# Patient Record
Sex: Female | Born: 1950 | Race: White | Hispanic: No
Health system: Southern US, Community
[De-identification: ages and names within clinical notes are randomized; demographics above are authoritative.]

---

## 2016-09-11 DIAGNOSIS — E785 Hyperlipidemia, unspecified: Secondary | ICD-10-CM | POA: Insufficient documentation

## 2016-09-11 DIAGNOSIS — R3129 Other microscopic hematuria: Secondary | ICD-10-CM | POA: Insufficient documentation

## 2016-10-04 ENCOUNTER — Other Ambulatory Visit: Payer: Self-pay | Admitting: Surgery

## 2016-10-04 DIAGNOSIS — R921 Mammographic calcification found on diagnostic imaging of breast: Secondary | ICD-10-CM

## 2016-10-09 ENCOUNTER — Ambulatory Visit
Admission: RE | Admit: 2016-10-09 | Discharge: 2016-10-09 | Disposition: A | Discharge status: Home / Self Care | Payer: BLUE CROSS/BLUE SHIELD | Source: Ambulatory Visit | Attending: Surgery | Admitting: Surgery

## 2016-10-09 DIAGNOSIS — R921 Mammographic calcification found on diagnostic imaging of breast: Secondary | ICD-10-CM

## 2017-06-08 DIAGNOSIS — Z23 Encounter for immunization: Secondary | ICD-10-CM | POA: Insufficient documentation

## 2018-08-14 IMAGING — MG MM CLIP PLACEMENT
2 series · 2 of 2 positions shown · non-contrast
Comparison: Previous exam(s).

CLINICAL DATA: Post biopsy mammogram of the left breast for clip
placement.

EXAM:
DIAGNOSTIC LEFT MAMMOGRAM POST STEREOTACTIC BIOPSY

[L CC]
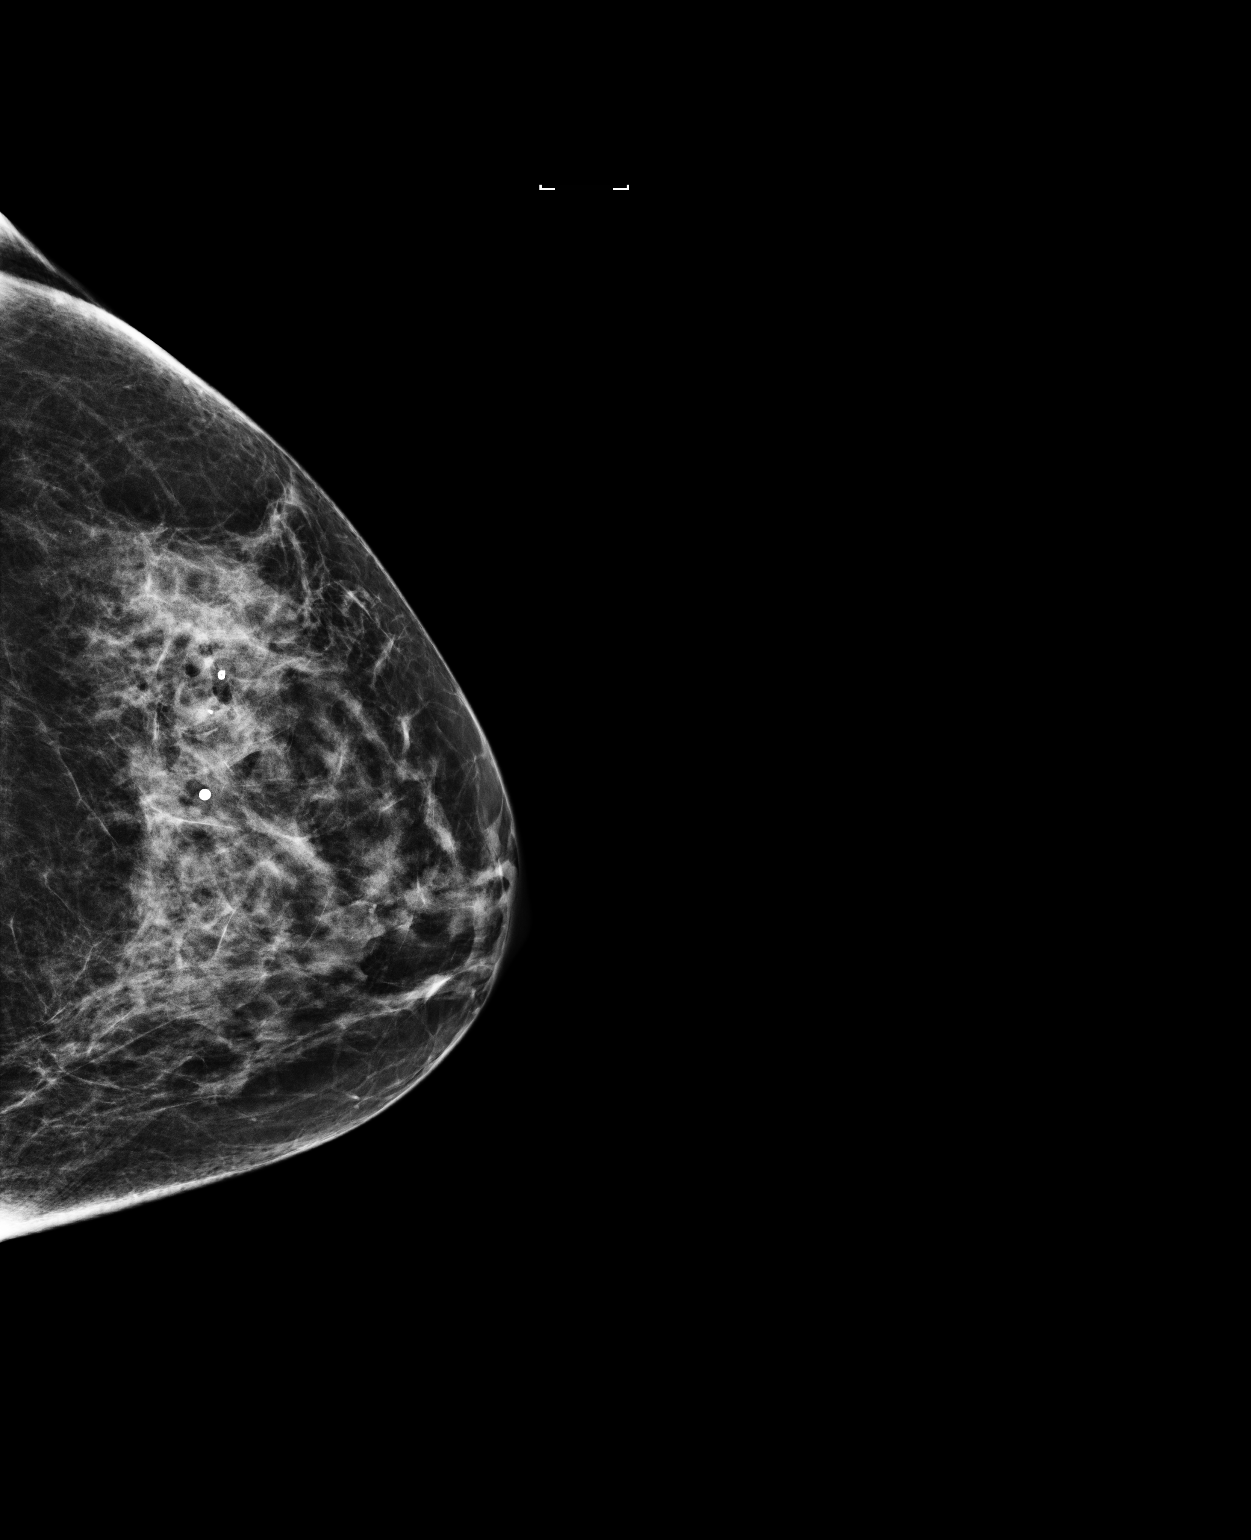

[L ML]
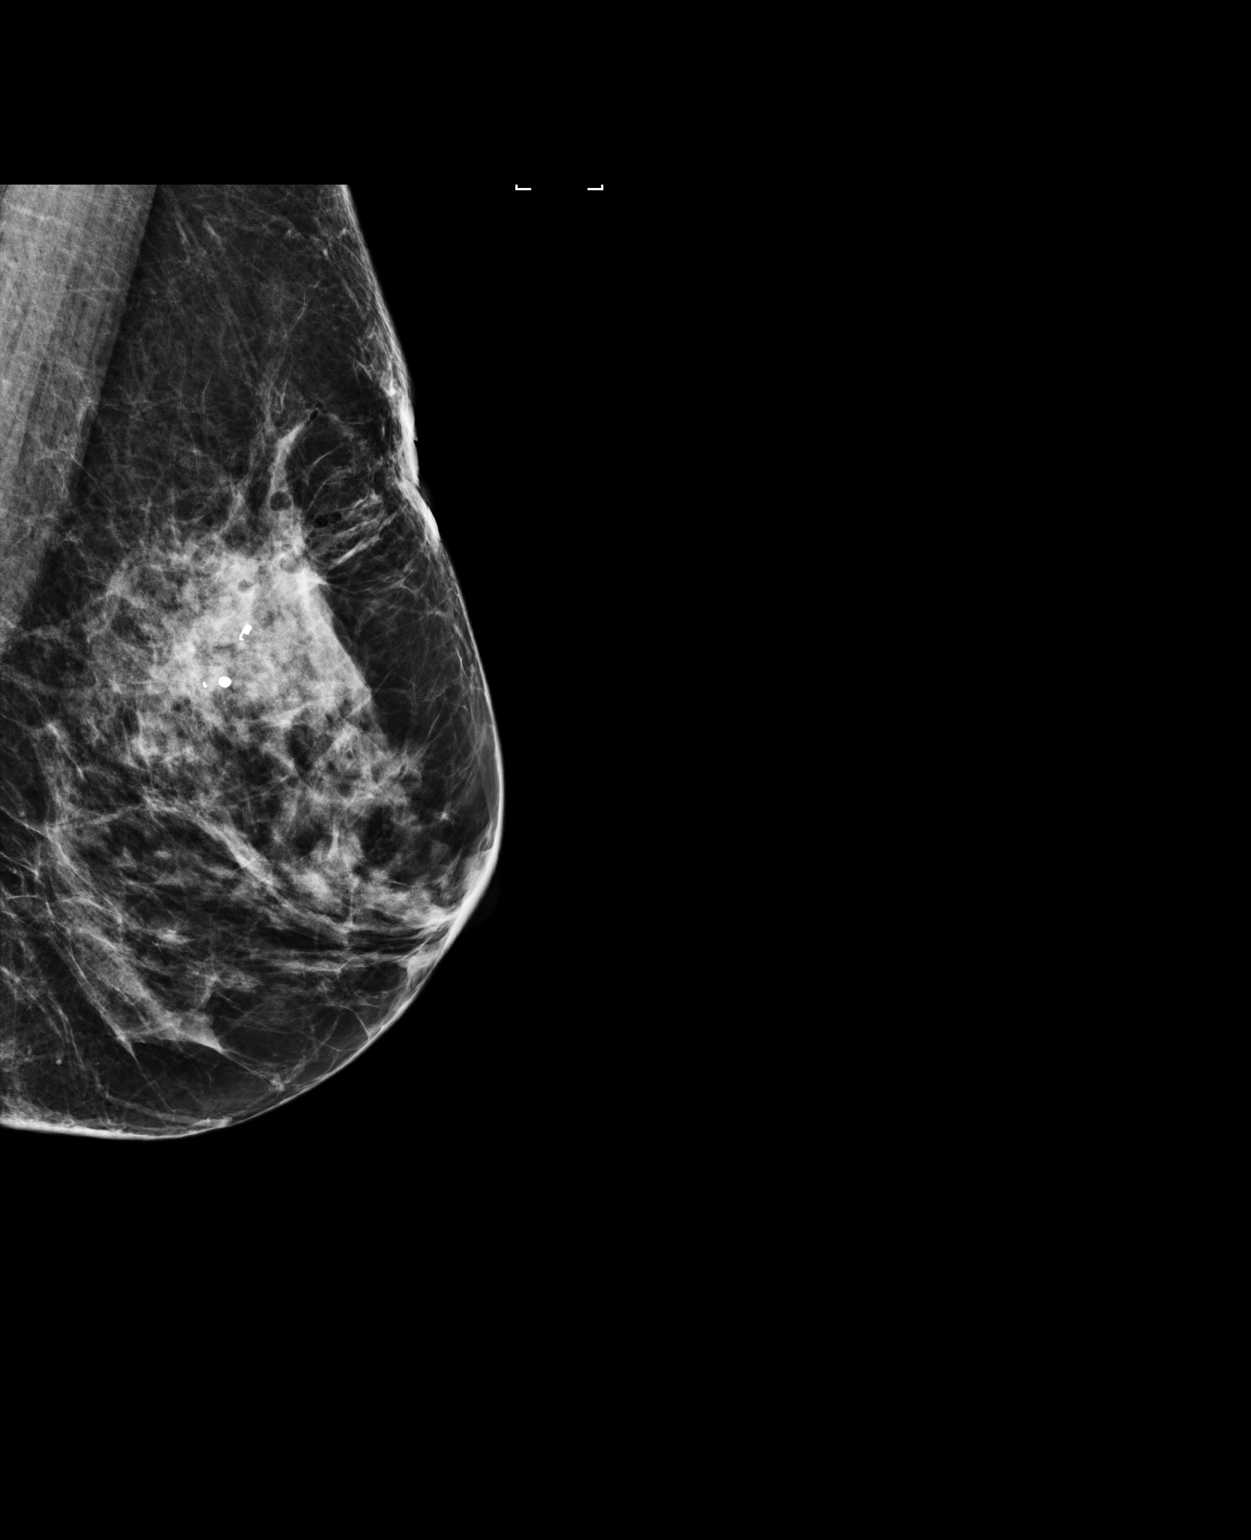

[2 of 2 positions shown; findings below may reference images not displayed]

FINDINGS: Mammographic images were obtained following stereotactic guided
biopsy of calcifications in the upper-outer quadrant of the left
breast. The coil shaped biopsy marking clip is appropriately
positioned at the intended site of biopsy in the upper-outer left
breast.
IMPRESSION: Appropriate positioning of the coil shaped biopsy marking clip in
the upper-outer left breast.

Final Assessment: Post Procedure Mammograms for Marker Placement

## 2019-04-30 DIAGNOSIS — E785 Hyperlipidemia, unspecified: Secondary | ICD-10-CM | POA: Diagnosis not present

## 2019-05-26 DIAGNOSIS — Z1211 Encounter for screening for malignant neoplasm of colon: Secondary | ICD-10-CM | POA: Diagnosis not present

## 2019-05-26 DIAGNOSIS — R030 Elevated blood-pressure reading, without diagnosis of hypertension: Secondary | ICD-10-CM | POA: Diagnosis not present

## 2019-05-26 DIAGNOSIS — Z23 Encounter for immunization: Secondary | ICD-10-CM | POA: Diagnosis not present

## 2019-05-26 DIAGNOSIS — E785 Hyperlipidemia, unspecified: Secondary | ICD-10-CM | POA: Diagnosis not present

## 2019-05-26 DIAGNOSIS — Z13820 Encounter for screening for osteoporosis: Secondary | ICD-10-CM | POA: Diagnosis not present

## 2019-06-11 DIAGNOSIS — Z1231 Encounter for screening mammogram for malignant neoplasm of breast: Secondary | ICD-10-CM | POA: Diagnosis not present

## 2019-07-02 DIAGNOSIS — H35371 Puckering of macula, right eye: Secondary | ICD-10-CM | POA: Diagnosis not present

## 2019-07-02 DIAGNOSIS — H25093 Other age-related incipient cataract, bilateral: Secondary | ICD-10-CM | POA: Diagnosis not present

## 2019-07-04 DIAGNOSIS — M85852 Other specified disorders of bone density and structure, left thigh: Secondary | ICD-10-CM | POA: Diagnosis not present

## 2019-07-04 DIAGNOSIS — Z780 Asymptomatic menopausal state: Secondary | ICD-10-CM | POA: Diagnosis not present

## 2019-07-04 DIAGNOSIS — M810 Age-related osteoporosis without current pathological fracture: Secondary | ICD-10-CM | POA: Diagnosis not present

## 2019-10-01 DIAGNOSIS — M71341 Other bursal cyst, right hand: Secondary | ICD-10-CM | POA: Diagnosis not present

## 2019-10-01 DIAGNOSIS — C44529 Squamous cell carcinoma of skin of other part of trunk: Secondary | ICD-10-CM | POA: Diagnosis not present

## 2019-10-01 DIAGNOSIS — L570 Actinic keratosis: Secondary | ICD-10-CM | POA: Diagnosis not present

## 2019-10-21 DIAGNOSIS — C44529 Squamous cell carcinoma of skin of other part of trunk: Secondary | ICD-10-CM | POA: Diagnosis not present

## 2020-01-19 DIAGNOSIS — E785 Hyperlipidemia, unspecified: Secondary | ICD-10-CM | POA: Diagnosis not present

## 2020-01-20 DIAGNOSIS — C44319 Basal cell carcinoma of skin of other parts of face: Secondary | ICD-10-CM | POA: Diagnosis not present

## 2020-01-20 DIAGNOSIS — L821 Other seborrheic keratosis: Secondary | ICD-10-CM | POA: Diagnosis not present

## 2020-01-20 DIAGNOSIS — X32XXXS Exposure to sunlight, sequela: Secondary | ICD-10-CM | POA: Diagnosis not present

## 2020-01-20 DIAGNOSIS — L570 Actinic keratosis: Secondary | ICD-10-CM | POA: Diagnosis not present

## 2020-01-20 DIAGNOSIS — I781 Nevus, non-neoplastic: Secondary | ICD-10-CM | POA: Diagnosis not present

## 2020-01-20 DIAGNOSIS — D485 Neoplasm of uncertain behavior of skin: Secondary | ICD-10-CM | POA: Diagnosis not present

## 2020-01-20 DIAGNOSIS — L812 Freckles: Secondary | ICD-10-CM | POA: Diagnosis not present

## 2020-01-20 DIAGNOSIS — L814 Other melanin hyperpigmentation: Secondary | ICD-10-CM | POA: Diagnosis not present

## 2020-01-22 DIAGNOSIS — M810 Age-related osteoporosis without current pathological fracture: Secondary | ICD-10-CM | POA: Diagnosis not present

## 2020-01-22 DIAGNOSIS — E785 Hyperlipidemia, unspecified: Secondary | ICD-10-CM | POA: Diagnosis not present

## 2020-01-22 DIAGNOSIS — M7062 Trochanteric bursitis, left hip: Secondary | ICD-10-CM | POA: Diagnosis not present

## 2020-01-22 DIAGNOSIS — Z1211 Encounter for screening for malignant neoplasm of colon: Secondary | ICD-10-CM | POA: Diagnosis not present

## 2020-01-22 DIAGNOSIS — R030 Elevated blood-pressure reading, without diagnosis of hypertension: Secondary | ICD-10-CM | POA: Diagnosis not present

## 2020-02-12 DIAGNOSIS — L821 Other seborrheic keratosis: Secondary | ICD-10-CM | POA: Diagnosis not present

## 2020-03-08 DIAGNOSIS — C44319 Basal cell carcinoma of skin of other parts of face: Secondary | ICD-10-CM | POA: Diagnosis not present

## 2020-05-05 DIAGNOSIS — L814 Other melanin hyperpigmentation: Secondary | ICD-10-CM | POA: Diagnosis not present

## 2020-05-05 DIAGNOSIS — X32XXXS Exposure to sunlight, sequela: Secondary | ICD-10-CM | POA: Diagnosis not present

## 2020-05-05 DIAGNOSIS — L570 Actinic keratosis: Secondary | ICD-10-CM | POA: Diagnosis not present

## 2020-05-05 DIAGNOSIS — Z85828 Personal history of other malignant neoplasm of skin: Secondary | ICD-10-CM | POA: Diagnosis not present

## 2020-05-05 DIAGNOSIS — L821 Other seborrheic keratosis: Secondary | ICD-10-CM | POA: Diagnosis not present

## 2020-06-24 DIAGNOSIS — Z1231 Encounter for screening mammogram for malignant neoplasm of breast: Secondary | ICD-10-CM | POA: Diagnosis not present

## 2020-07-20 DIAGNOSIS — H35371 Puckering of macula, right eye: Secondary | ICD-10-CM | POA: Diagnosis not present

## 2020-07-20 DIAGNOSIS — H25093 Other age-related incipient cataract, bilateral: Secondary | ICD-10-CM | POA: Diagnosis not present

## 2020-10-11 DIAGNOSIS — M21612 Bunion of left foot: Secondary | ICD-10-CM | POA: Insufficient documentation

## 2020-10-11 DIAGNOSIS — M205X2 Other deformities of toe(s) (acquired), left foot: Secondary | ICD-10-CM | POA: Insufficient documentation

## 2020-10-29 DIAGNOSIS — Z8616 Personal history of COVID-19: Secondary | ICD-10-CM | POA: Diagnosis not present

## 2020-10-29 DIAGNOSIS — Z0184 Encounter for antibody response examination: Secondary | ICD-10-CM | POA: Diagnosis not present

## 2020-11-11 DIAGNOSIS — Z85828 Personal history of other malignant neoplasm of skin: Secondary | ICD-10-CM | POA: Diagnosis not present

## 2020-11-11 DIAGNOSIS — L570 Actinic keratosis: Secondary | ICD-10-CM | POA: Diagnosis not present

## 2020-11-11 DIAGNOSIS — L814 Other melanin hyperpigmentation: Secondary | ICD-10-CM | POA: Diagnosis not present

## 2020-11-11 DIAGNOSIS — D239 Other benign neoplasm of skin, unspecified: Secondary | ICD-10-CM | POA: Diagnosis not present

## 2020-11-11 DIAGNOSIS — X32XXXS Exposure to sunlight, sequela: Secondary | ICD-10-CM | POA: Diagnosis not present

## 2020-11-11 DIAGNOSIS — L821 Other seborrheic keratosis: Secondary | ICD-10-CM | POA: Diagnosis not present

## 2021-06-29 DIAGNOSIS — Z1231 Encounter for screening mammogram for malignant neoplasm of breast: Secondary | ICD-10-CM | POA: Diagnosis not present

## 2021-10-12 ENCOUNTER — Ambulatory Visit: Payer: PPO | Admitting: Podiatry

## 2021-10-12 ENCOUNTER — Other Ambulatory Visit: Payer: Self-pay

## 2021-10-12 ENCOUNTER — Ambulatory Visit (INDEPENDENT_AMBULATORY_CARE_PROVIDER_SITE_OTHER): Payer: PPO

## 2021-10-12 DIAGNOSIS — M2012 Hallux valgus (acquired), left foot: Secondary | ICD-10-CM

## 2021-10-12 NOTE — Progress Notes (Signed)
° °  Subjective: 71 y.o. female presenting today as a new patient for evaluation of a symptomatic hammertoe to the left second toe.  Patient states that she has seen another podiatrist in [facility] and she is presenting for a second opinion.  She says that it is very painful in shoes and she has tried multiple conservative modalities including strappings and paddings with shoe gear modifications and no improvement.  She continues to have pain almost on a daily basis.  She presents for treatment and evaluation  No past medical history on file.  Allergies  Allergen Reactions   Codeine Anaphylaxis    Objective: Physical Exam General: The patient is alert and oriented x3 in no acute distress.  Dermatology: Skin is cool, dry and supple bilateral lower extremities. Negative for open lesions or macerations.  Vascular: Palpable pedal pulses bilaterally. No edema or erythema noted. Capillary refill within normal limits.  Neurological: Epicritic and protective threshold grossly intact bilaterally.   Musculoskeletal Exam: Clinical evidence of bunion deformity noted to the respective foot. There is moderate pain on palpation range of motion of the first MPJ. Lateral deviation of the hallux noted consistent with hallux abductovalgus. Hammertoe contracture also noted on clinical exam to digits #2 of the left foot. Symptomatic pain on palpation and range of motion also noted to the metatarsal phalangeal joints of the respective hammertoe digits.    Radiographic Exam: Increased intermetatarsal angle greater than 15 with a hallux abductus angle greater than 30 noted on AP view. Moderate degenerative changes noted within the first MPJ. Contracture deformity also noted to the interphalangeal joints and MPJs of the digits of the respective hammertoes.    Assessment: 1. HAV w/ bunion deformity left 2. Hammertoe deformity second digit left   Plan of Care:  1. Patient was evaluated. X-Rays reviewed. 2.   Today we discussed surgical and conservative options for the hammertoe and bunion deformity.  I explained to the patient that in order to address the hammertoe we do need to address the hallux valgus which is crowding the second toe.  Patient understands.  Currently she is not in a position to go through surgery.  She continues to work part-time. 3.  Return to clinic when she is ready for surgical consult.  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 Edrick Kins, DPM    [address],  [address]                Office [phone]  Fax [phone]

## 2021-11-07 ENCOUNTER — Other Ambulatory Visit: Payer: Self-pay

## 2021-11-07 ENCOUNTER — Ambulatory Visit: Payer: PPO | Admitting: Podiatry

## 2021-11-07 DIAGNOSIS — M2042 Other hammer toe(s) (acquired), left foot: Secondary | ICD-10-CM

## 2021-11-07 DIAGNOSIS — M2012 Hallux valgus (acquired), left foot: Secondary | ICD-10-CM | POA: Diagnosis not present

## 2021-11-07 NOTE — Progress Notes (Signed)
° °  Subjective: 71 y.o. female presenting today for follow-up evaluation of a symptomatic hammertoe and bunion to the left foot.  Patient states that since she left the office a few weeks ago she has had nothing but pain and tenderness associated to the foot despite different shoe gear modifications and attempts at alleviating her bunion and hammertoe.  She is ready to proceed with surgery.  It is affecting her daily quality of life and daily activities.  She presents for further treatment and evaluation  No past medical history on file.  Allergies  Allergen Reactions   Codeine Anaphylaxis    Objective: Physical Exam General: The patient is alert and oriented x3 in no acute distress.  Dermatology: Skin is cool, dry and supple bilateral lower extremities. Negative for open lesions or macerations.  Vascular: Palpable pedal pulses bilaterally. No edema or erythema noted. Capillary refill within normal limits.  Neurological: Epicritic and protective threshold grossly intact bilaterally.   Musculoskeletal Exam: Clinical evidence of bunion deformity noted to the respective foot. There is moderate pain on palpation range of motion of the first MPJ. Lateral deviation of the hallux noted consistent with hallux abductovalgus. Hammertoe contracture also noted on clinical exam to digits #2 of the left foot. Symptomatic pain on palpation and range of motion also noted to the metatarsal phalangeal joints of the respective hammertoe digits.    Radiographic Exam 10/12/2021 LT foot: Increased intermetatarsal angle greater than 15 with a hallux abductus angle greater than 30 noted on AP view. Moderate degenerative changes noted within the first MPJ. Contracture deformity also noted to the interphalangeal joints and MPJs of the digits of the respective hammertoes.  Assessment: 1. HAV w/ bunion deformity left 2. Hammertoe deformity second digit left  Plan of Care:  1. Patient was evaluated. X-Rays reviewed  again today with the patient. 2. Today we discussed the conservative versus surgical management of the presenting pathology. The patient opts for surgical management. All possible complications and details of the procedure were explained. All patient questions were answered. No guarantees were expressed or implied. 3. Authorization for surgery was initiated today. Surgery will consist of bunionectomy with double osteotomy left.  PIPJ arthroplasty with second MTP capsulotomy left.  Possible Weil shortening metatarsal osteotomy second left. 4.  Planning to take approximately 1 week off of work.  She says that when she returns to work she can sit with her foot propped up and elevated.   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 Edrick Kins, DPM    [address],  [address]                Office [phone]  Fax [phone]

## 2021-11-18 ENCOUNTER — Telehealth: Payer: Self-pay | Admitting: Urology

## 2021-11-18 NOTE — Telephone Encounter (Signed)
DOS - 12/15/21  DOUBLE OSTEOTOMY LEFT --- 28299 METATARSAL OSTEOTOMY 2ND LEFT --- 51102 HAMMERTOE REPAIR 2ND LEFT --- 11173  [facility] EFFECTIVE DATE - 10/02/21  RECEIVED FAX FROM [facility] STATING THAT CPT CODES 56701, 41030 AND 13143 HAS BEEN APPROVED, AUTH # [id number], GOOD FROM 12/15/21 - 03/15/22.

## 2021-12-06 ENCOUNTER — Telehealth: Payer: Self-pay | Admitting: Podiatry

## 2021-12-06 NOTE — Telephone Encounter (Signed)
Perfectly fine. Just notify our front desk and she can come in and get one. Thanks, Dr. Amalia Hailey

## 2021-12-06 NOTE — Telephone Encounter (Signed)
Pt has foot surgery scheduled on 12/15/21 with Dr. Amalia Hailey and has a handicap form to be filled out by the provider. She would like to drop this off at the office or she can fax or e-mail to [place]. Can this be done prior to her surgery date? ? ?Please advise. ?

## 2021-12-15 ENCOUNTER — Encounter: Payer: Self-pay | Admitting: Podiatry

## 2021-12-15 ENCOUNTER — Other Ambulatory Visit: Payer: Self-pay | Admitting: Podiatry

## 2021-12-15 DIAGNOSIS — M2042 Other hammer toe(s) (acquired), left foot: Secondary | ICD-10-CM | POA: Diagnosis not present

## 2021-12-15 DIAGNOSIS — M21542 Acquired clubfoot, left foot: Secondary | ICD-10-CM | POA: Diagnosis not present

## 2021-12-15 DIAGNOSIS — M2012 Hallux valgus (acquired), left foot: Secondary | ICD-10-CM | POA: Diagnosis not present

## 2021-12-15 MED ORDER — MELOXICAM 15 MG PO TABS: 15.0000 mg | ORAL_TABLET | Freq: Every day | ORAL | 1 refills | Status: AC

## 2021-12-15 MED ORDER — OXYCODONE-ACETAMINOPHEN 5-325 MG PO TABS: 1.0000 | ORAL_TABLET | ORAL | 0 refills | Status: AC | PRN

## 2021-12-15 NOTE — Progress Notes (Signed)
PRN postop 

## 2021-12-21 ENCOUNTER — Other Ambulatory Visit: Payer: Self-pay

## 2021-12-21 ENCOUNTER — Ambulatory Visit (INDEPENDENT_AMBULATORY_CARE_PROVIDER_SITE_OTHER): Payer: PPO

## 2021-12-21 ENCOUNTER — Ambulatory Visit (INDEPENDENT_AMBULATORY_CARE_PROVIDER_SITE_OTHER): Payer: PPO | Admitting: Podiatry

## 2021-12-21 DIAGNOSIS — Z9889 Other specified postprocedural states: Secondary | ICD-10-CM | POA: Diagnosis not present

## 2021-12-21 NOTE — Progress Notes (Signed)
? ?  Subjective:  ?Patient presents today status post bunionectomy with first metatarsal osteotomy and hammertoe repair second digit left foot. DOS: 12/15/2021.  Patient states that she is doing well.  She has hardly any pain.  She is kept the dressings clean dry and intact with minimal weightbearing in the cam boot with the assistance of a knee scooter. ? ?No past medical history on file. ? ?Allergies  ?Allergen Reactions  ? Codeine Anaphylaxis  ? ?Objective/Physical Exam ?Neurovascular status intact.  Skin incisions appear to be well coapted with sutures and staples intact. No sign of infectious process noted. No dehiscence. No active bleeding noted. Moderate edema noted to the surgical extremity. ? ?Radiographic Exam:  ?Orthopedic hardware and osteotomies sites appear to be stable with routine healing. ? ?Assessment: ?1. s/p bunionectomy and hammertoe repair second digit left. DOS: 12/15/2021 ? ? ?Plan of Care:  ?1. Patient was evaluated. X-rays reviewed ?2.  Dressings changed.  CDI x1 week ?3.  Continue minimal weightbearing in the cam boot with the assistance of the knee scooter ?4.  Return to clinic 1 week ? 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 ? ?Edrick Kins, DPM ?[place] ? ?Dr. Edrick Kins, DPM  ?  ?[address]                                    ?[place],  [address]                ?Office [phone]  ?Fax [phone] ? ? ? ? ? ?

## 2021-12-28 ENCOUNTER — Ambulatory Visit (INDEPENDENT_AMBULATORY_CARE_PROVIDER_SITE_OTHER): Payer: PPO | Admitting: Podiatry

## 2021-12-28 ENCOUNTER — Encounter: Payer: PPO | Admitting: Podiatry

## 2021-12-28 ENCOUNTER — Other Ambulatory Visit: Payer: Self-pay

## 2021-12-28 ENCOUNTER — Telehealth: Payer: Self-pay | Admitting: *Deleted

## 2021-12-28 DIAGNOSIS — Z9889 Other specified postprocedural states: Secondary | ICD-10-CM

## 2021-12-28 NOTE — Telephone Encounter (Signed)
Recommend patient sleeps with the boot on as long as the percutaneous pin is still in the toe.  Please notify patient.-Dr. Amalia Hailey

## 2021-12-28 NOTE — Progress Notes (Signed)
? ?  Subjective:  ?Patient presents today status post bunionectomy with first metatarsal osteotomy and hammertoe repair second digit left foot. DOS: 12/15/2021.  Patient continues to do well.  She continues to use the knee scooter as well as the cam boot.  No new complaints at this time ? ?No past medical history on file. ? ?Allergies  ?Allergen Reactions  ? Codeine Anaphylaxis  ? ?Objective/Physical Exam ?Neurovascular status intact.  Skin incisions appear to be well coapted with sutures and staples intact. No sign of infectious process noted. No dehiscence. No active bleeding noted.  Negative for any significant edema noted to the surgical extremity. ? ?Assessment: ?1. s/p bunionectomy and hammertoe repair second digit left. DOS: 12/15/2021 ? ? ?Plan of Care:  ?1. Patient was evaluated.  ?2.  Staples removed today ?3.  Patient may begin washing and showering and getting the foot wet ?4.  Continue minimal weightbearing in the cam boot ?5.  Return to clinic in 2 weeks for percutaneous pin removal ? 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 ? ?Edrick Kins, DPM ?[place] ? ?Dr. Edrick Kins, DPM  ?  ?[address]                                    ?[place], [place] [address]                ?Office [phone]  ?Fax [phone] ? ? ? ? ? ?

## 2021-12-28 NOTE — Telephone Encounter (Signed)
Patient is calling to ask if she should be wearing her boot for sleeping since staples were removed today. Please advise. ?

## 2021-12-29 NOTE — Telephone Encounter (Signed)
Patient has been notified of recommendations

## 2022-01-09 ENCOUNTER — Encounter: Payer: PPO | Admitting: Podiatry

## 2022-01-11 ENCOUNTER — Ambulatory Visit (INDEPENDENT_AMBULATORY_CARE_PROVIDER_SITE_OTHER): Payer: PPO

## 2022-01-11 ENCOUNTER — Encounter: Payer: PPO | Admitting: Podiatry

## 2022-01-11 ENCOUNTER — Encounter: Payer: Self-pay | Admitting: Podiatry

## 2022-01-11 ENCOUNTER — Ambulatory Visit (INDEPENDENT_AMBULATORY_CARE_PROVIDER_SITE_OTHER): Payer: PPO | Admitting: Podiatry

## 2022-01-11 DIAGNOSIS — Z9889 Other specified postprocedural states: Secondary | ICD-10-CM

## 2022-01-11 NOTE — Progress Notes (Signed)
?  Subjective:  ?Patient ID: Stephanie English, female    DOB: [birth year],  MRN: [id number] ? ?No chief complaint on file. ? ? ?DOS: 12/15/21 ?Procedure: left foot bunionectomy with first metatarsal osteotomy and hammertoe repair of second digit.  ? ?71 y.o. female returns for POV#3. Relates she is doing well with some minor swelling. Here today for pin removal.  ? ?Review of Systems: Negative except as noted in the HPI. Denies N/V/F/Ch. ? ?No past medical history on file. ? ?Current Outpatient Medications:  ?  meloxicam (MOBIC) 15 MG tablet, Take 1 tablet (15 mg total) by mouth daily., Disp: 30 tablet, Rfl: 1 ?  Multiple Vitamin (QUINTABS) TABS, Take 1 tablet by mouth daily., Disp: , Rfl:  ?  oxyCODONE-acetaminophen (PERCOCET) 5-325 MG tablet, Take 1 tablet by mouth every 4 (four) hours as needed for severe pain., Disp: 30 tablet, Rfl: 0 ?  pravastatin (PRAVACHOL) 20 MG tablet, Take by mouth., Disp: , Rfl:  ? ?Social History  ? ?Tobacco Use  ?Smoking Status Not on file  ?Smokeless Tobacco Not on file  ? ? ?Allergies  ?Allergen Reactions  ? Codeine Anaphylaxis  ? ?Objective:  ?There were no vitals filed for this visit. ?There is no height or weight on file to calculate BMI. ?Constitutional Well developed. ?Well nourished.  ?Vascular Foot warm and well perfused. ?Capillary refill normal to all digits.   ?Neurologic Normal speech. ?Oriented to person, place, and time. ?Epicritic sensation to light touch grossly present bilaterally.  ?Dermatologic Skin healing well without signs of infection. Skin edges well coapted without signs of infection.  ?Orthopedic: Tenderness to palpation noted about the surgical site.  ? ?Radiographs: Interval removal of pin. Other hardware intact and toe well aligned. Some rotation noted to distal fragment of first metatarsal  Consolidation of osteotomy site noted from previous.  ?Assessment:  ? ?1. Status post left foot surgery   ? ?Plan:  ?Patient was evaluated and treated and all questions  answered. ? ?S/p foot surgery left ?-Progressing as expected post-operatively. ?-WB Status: May continue weightbearing minimally as tolerated in CAM boot.  ?-Pin removed without incident.  ?-Medications: n/a ?-Foot redressed. ?Follow-up with Dr. Amalia Hailey in 2-3 weeks.  ? ?No follow-ups on file.  ? ?

## 2022-01-16 ENCOUNTER — Ambulatory Visit (INDEPENDENT_AMBULATORY_CARE_PROVIDER_SITE_OTHER): Payer: PPO | Admitting: Podiatry

## 2022-01-16 DIAGNOSIS — Z9889 Other specified postprocedural states: Secondary | ICD-10-CM

## 2022-01-16 NOTE — Progress Notes (Signed)
? ?  Subjective:  ?Patient presents today status post bunionectomy with first metatarsal osteotomy and hammertoe repair second digit left foot. DOS: 12/15/2021.  Patient is doing well.  She has been weightbearing in the cam boot as instructed.  No new complaints at this time ? ?No past medical history on file. ? ?Allergies  ?Allergen Reactions  ? Codeine Anaphylaxis  ? ?Objective/Physical Exam ?Neurovascular status intact.  Skin incisions healed.  There is some edema noted around the surgical area and forefoot.  Negative for any significant tenderness to palpation.  There is some limited range of motion of the first and second MTP joints ? ?Assessment: ?1. s/p bunionectomy and hammertoe repair second digit left. DOS: 12/15/2021 ? ? ?Plan of Care:  ?1. Patient was evaluated.  ?2.  Patient may begin to transition out of the cam boot into good supportive shoes and sneakers.  Slow transition over the next 2-3 weeks ?3.  Recommend daily range of motion exercises to the first and second MTP joint.  This was demonstrated in office ?4.  Compression ankle sleeve dispensed.  Wear daily ?5.  Return to clinic 4 weeks for follow-up x-ray ? 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 ? ?Edrick Kins, DPM ?[place] ? ?Dr. Edrick Kins, DPM  ?  ?[address]                                    ?[place], [place] [address]                ?Office [phone]  ?Fax [phone] ? ? ? ? ? ?

## 2022-02-13 ENCOUNTER — Ambulatory Visit (INDEPENDENT_AMBULATORY_CARE_PROVIDER_SITE_OTHER): Payer: PPO

## 2022-02-13 ENCOUNTER — Ambulatory Visit (INDEPENDENT_AMBULATORY_CARE_PROVIDER_SITE_OTHER): Payer: PPO | Admitting: Podiatry

## 2022-02-13 DIAGNOSIS — Z9889 Other specified postprocedural states: Secondary | ICD-10-CM | POA: Diagnosis not present

## 2022-02-13 NOTE — Progress Notes (Signed)
? ?  Subjective:  ?Patient presents today status post bunionectomy with first metatarsal osteotomy and hammertoe repair second digit left foot. DOS: 12/15/2021.  Patient states that she is doing very well.  She is wearing regular tennis shoes and has no pain with activity.  She says that she continues to have some mild intermittent swelling throughout the day.  She has been wearing the compression sock daily ? ?No past medical history on file. ? ?Allergies  ?Allergen Reactions  ? Codeine Anaphylaxis  ? ?Objective/Physical Exam ?Neurovascular status intact.  Skin incisions healed.  Decent range of motion of the first and second MTP joint.  Clinically the first and second rays appears rectus with good alignment although there is some movement radiographically ? ?Radiographic exam LT foot 02/13/2022 ?There is some translocation of the capital fragment of the first metatarsal with movement of the orthopedic screws postoperatively.  Today the orthopedic screws appear stable.  There is no radiolucency around the screw.  Osteotomy site appears healed.  Decent alignment of the first and second rays. ? ?Assessment: ?1. s/p bunionectomy and hammertoe repair second digit left. DOS: 12/15/2021 ? ? ?Plan of Care:  ?1. Patient was evaluated.  X-rays reviewed with the patient and explained that there was some movement postoperatively of the capital fragment of the first metatarsal.  Overall the patient is very satisfied however.  She has no pain. ?2.  Overall the patient is doing well.  She may now resume full activity no restrictions ?3.  Continue daily range of motion exercises and good supportive shoes ?4.  Continue compression ankle sleeve.  Wear daily as needed ?5.  Return to clinic as needed ? 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 ? ?Edrick Kins, DPM ?[place] ? ?Dr. Edrick Kins, DPM  ?  ?[address]                                     ?[place], [place] [address]                ?Office [phone]  ?Fax [phone] ? ? ? ? ? ?

## 2022-05-15 ENCOUNTER — Telehealth: Payer: Self-pay | Admitting: *Deleted

## 2022-05-15 NOTE — Telephone Encounter (Signed)
Patient is calling,has questions for the physician, how many screws used in toe, what are they made of, (surgery in May). Please advise.

## 2022-05-17 NOTE — Telephone Encounter (Signed)
Just spoke with the patient.  Larene [place] could you please contact this patient and schedule a follow-up appointment for her?  She prefers mornings.  No urgency just next available.  Thanks so much, Dr. Amalia Hailey

## 2022-05-18 NOTE — Telephone Encounter (Signed)
Please schedule, thanks.

## 2022-06-06 ENCOUNTER — Ambulatory Visit (INDEPENDENT_AMBULATORY_CARE_PROVIDER_SITE_OTHER): Payer: PPO

## 2022-06-06 ENCOUNTER — Ambulatory Visit: Payer: PPO | Admitting: Podiatry

## 2022-06-06 DIAGNOSIS — M2042 Other hammer toe(s) (acquired), left foot: Secondary | ICD-10-CM

## 2022-06-06 NOTE — Progress Notes (Signed)
   Chief Complaint  Patient presents with   Foot Pain    Right foot pain.    Subjective:  Patient presents today status post bunionectomy with first metatarsal osteotomy and hammertoe repair second digit left foot. DOS: 12/15/2021.  Patient has noticed some slight discomfort with full activity.  Overall she says that she is doing very well with no swelling or significant pain.  She says the discomfort is a 2/10 during activity.  She would like to have it evaluated today  No past medical history on file.  Allergies  Allergen Reactions   Codeine Anaphylaxis   Objective/Physical Exam Mostly unchanged since last visit.  Neurovascular status intact.  Skin incisions healed.  Decent range of motion of the first and second MTP joint.  Clinically the first and second rays appears rectus with good alignment although there is some movement radiographically  Radiographic exam LT foot 02/13/2022 Stable since prior x-rays.  There is to be translocation of the capital fragment of the first metatarsal with movement of the orthopedic screws postoperatively.  Malalignment of the first metatarsal noted.  The osteotomy site is healing routinely.    Decent alignment of the second ray  Assessment: 1. s/p bunionectomy and hammertoe repair second digit left. DOS: 12/15/2021   Plan of Care:  1. Patient was evaluated.  X-rays reviewed  2.  Patient may continue full activity no restrictions.  Overall she says that she is doing well but she would just like to have her feet evaluated. 3.  Continue wearing good supportive shoes and sneakers 4.  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 Edrick Kins, DPM    [address] [place], [place] [address]                Office [phone]  Fax [phone]

## 2022-06-07 ENCOUNTER — Telehealth: Payer: Self-pay | Admitting: *Deleted

## 2022-06-07 NOTE — Telephone Encounter (Signed)
Patient is calling to ask physician if screws in toes will interfere with medal detectors, please advise.

## 2022-06-07 NOTE — Telephone Encounter (Signed)
Know that will not interfere with metal detectors.  Please notify patient.  Thanks, Dr. Amalia Hailey

## 2022-06-09 NOTE — Telephone Encounter (Signed)
Spoke with patient giving information per physician, verbalized understanding.

## 2023-08-24 LAB — COLOGUARD: COLOGUARD: NEGATIVE

## 2024-07-25 ENCOUNTER — Ambulatory Visit: Admitting: Podiatry

## 2024-07-25 DIAGNOSIS — M7751 Other enthesopathy of right foot: Secondary | ICD-10-CM | POA: Diagnosis not present

## 2024-07-25 DIAGNOSIS — M10071 Idiopathic gout, right ankle and foot: Secondary | ICD-10-CM

## 2024-07-25 MED ORDER — METHYLPREDNISOLONE 4 MG PO TBPK: ORAL_TABLET | ORAL | 0 refills | Status: AC

## 2024-07-25 MED ORDER — COLCHICINE 0.6 MG PO TABS: 0.6000 mg | ORAL_TABLET | Freq: Every day | ORAL | 0 refills | Status: AC

## 2024-07-25 NOTE — Progress Notes (Signed)
  Subjective:  Patient ID: Stephanie English, female    DOB: 26-Aug-1951,  MRN: [id number]  Chief Complaint  Patient presents with   Toe Pain    Right foot great toe red swollen warm to the touch pt stated that she believes it is gout     73 y.o. female presents with the above complaint.  Patient presents with right first metatarsophalangeal joint pain.  She states is red swollen hot to touch.  She states that it might be a gout flare.  She has not had a flare before.  She does have intake and red meat she denies any other acute complaints.  Would like to discuss treatment options for this pain scale 7 out of 10 dull aching nature.  Hurts with ambulation worse with pressure   Review of Systems: Negative except as noted in the HPI. Denies N/V/F/Ch.  No past medical history on file.  Current Outpatient Medications:    colchicine 0.6 MG tablet, Take 1 tablet (0.6 mg total) by mouth daily., Disp: 30 tablet, Rfl: 0   methylPREDNISolone (MEDROL DOSEPAK) 4 MG TBPK tablet, Take as directed, Disp: 21 each, Rfl: 0   meloxicam  (MOBIC ) 15 MG tablet, Take 1 tablet (15 mg total) by mouth daily., Disp: 30 tablet, Rfl: 1   Multiple Vitamin (QUINTABS) TABS, Take 1 tablet by mouth daily., Disp: , Rfl:    oxyCODONE -acetaminophen  (PERCOCET) 5-325 MG tablet, Take 1 tablet by mouth every 4 (four) hours as needed for severe pain., Disp: 30 tablet, Rfl: 0   pravastatin (PRAVACHOL) 20 MG tablet, Take by mouth., Disp: , Rfl:   Social History   Tobacco Use  Smoking Status Not on file  Smokeless Tobacco Not on file    Allergies  Allergen Reactions   Codeine Anaphylaxis   Objective:  There were no vitals filed for this visit. There is no height or weight on file to calculate BMI. Constitutional Well developed. Well nourished.  Vascular Dorsalis pedis pulses palpable bilaterally. Posterior tibial pulses palpable bilaterally. Capillary refill normal to all digits.  No cyanosis or clubbing noted. Pedal hair  growth normal.  Neurologic Normal speech. Oriented to person, place, and time. Epicritic sensation to light touch grossly present bilaterally.  Dermatologic Nails well groomed and normal in appearance. No open wounds. No skin lesions.  Orthopedic: Pain on palpation right first metatarsophalangeal joint pain with range of motion of the joint no deep intra-articular pain noted no crepitus noted red hot swollen joint noted.   Radiographs: None Assessment:   1. Acute idiopathic gout involving toe of right foot   2. Capsulitis of metatarsophalangeal (MTP) joint of right foot    Plan:  Patient was evaluated and treated and all questions answered.  Right first MTP capsulitis with underlying gout flare - All questions and concerns were discussed with the patient given the amount of pain that she is having she would benefit from a steroid injection decreased at inflammatory, surgical pain.  Patient agrees with plan like to proceed with steroid injection -A steroid injection was performed at right first MTP using 1% plain Lidocaine and 10 mg of Kenalog. This was well tolerated. - I discussed diet management as well in extensive detail. - I discussed shoe gear modification as well   No follow-ups on file.

## 2024-08-06 ENCOUNTER — Telehealth: Payer: Self-pay

## 2024-08-06 NOTE — Telephone Encounter (Signed)
 Patient called in with questions about colchicine. She had stopped taking it after a few days, she was feeling much better. Now her left foot is starting to swell with pockets of gout. She restarted the colchicine and wanted to make sure this was OK. Advised that it's fine to take the colchicine. She will keep her follow up appointment on 11/21 as scheduled. She will call back if she needs to get in sooner. thanks

## 2024-08-22 ENCOUNTER — Ambulatory Visit: Admitting: Podiatry

## 2024-08-22 DIAGNOSIS — M7751 Other enthesopathy of right foot: Secondary | ICD-10-CM

## 2024-08-22 DIAGNOSIS — M10071 Idiopathic gout, right ankle and foot: Secondary | ICD-10-CM

## 2024-08-22 NOTE — Progress Notes (Signed)
  Subjective:  Patient ID: Stephanie English, female    DOB: March 12, 1951,  MRN: [id number]  Chief Complaint  Patient presents with   Gout    Pt is here for a follow up for gout     73 y.o. female presents with the above complaint.  Patient presents for follow-up right first MTP capsulitis.  She states she is doing a lot better.  Denies any other acute complaints of flareup is gone no further pain.   Review of Systems: Negative except as noted in the HPI. Denies N/V/F/Ch.  No past medical history on file.  Current Outpatient Medications:    colchicine  0.6 MG tablet, Take 1 tablet (0.6 mg total) by mouth daily., Disp: 30 tablet, Rfl: 0   meloxicam  (MOBIC ) 15 MG tablet, Take 1 tablet (15 mg total) by mouth daily., Disp: 30 tablet, Rfl: 1   methylPREDNISolone  (MEDROL  DOSEPAK) 4 MG TBPK tablet, Take as directed, Disp: 21 each, Rfl: 0   Multiple Vitamin (QUINTABS) TABS, Take 1 tablet by mouth daily., Disp: , Rfl:    oxyCODONE -acetaminophen  (PERCOCET) 5-325 MG tablet, Take 1 tablet by mouth every 4 (four) hours as needed for severe pain., Disp: 30 tablet, Rfl: 0   pravastatin (PRAVACHOL) 20 MG tablet, Take by mouth., Disp: , Rfl:   Social History   Tobacco Use  Smoking Status Not on file  Smokeless Tobacco Not on file    Allergies  Allergen Reactions   Codeine Anaphylaxis   Objective:  There were no vitals filed for this visit. There is no height or weight on file to calculate BMI. Constitutional Well developed. Well nourished.  Vascular Dorsalis pedis pulses palpable bilaterally. Posterior tibial pulses palpable bilaterally. Capillary refill normal to all digits.  No cyanosis or clubbing noted. Pedal hair growth normal.  Neurologic Normal speech. Oriented to person, place, and time. Epicritic sensation to light touch grossly present bilaterally.  Dermatologic Nails well groomed and normal in appearance. No open wounds. No skin lesions.  Orthopedic: No further further in on  palpation right first metatarsophalangeal joint pain with range of motion of the joint no deep intra-articular no further pain noted no crepitus noted red hot swollen joint noted.   Radiographs: None Assessment:   1. Acute idiopathic gout involving toe of right foot   2. Capsulitis of metatarsophalangeal (MTP) joint of right foot     Plan:  Patient was evaluated and treated and all questions answered.  Right first MTP capsulitis with underlying gout flare - Clinically healed official discharge from my care I discussed all the other treatment options and prevention techniques for gout flare but discussed diet management shoe gear modification orthotics she states understanding if any foot and ankle issues are future she will come back and see me.   No follow-ups on file.
